# Patient Record
Sex: Female | Born: 1990 | Race: White | Hispanic: No | Marital: Single | Smoking: Never smoker
Health system: Southern US, Community
[De-identification: ages and names within clinical notes are randomized; demographics above are authoritative.]

---

## 2013-05-14 ENCOUNTER — Encounter (HOSPITAL_COMMUNITY): Payer: Self-pay | Admitting: Emergency Medicine

## 2013-05-14 ENCOUNTER — Emergency Department (HOSPITAL_COMMUNITY)
Admission: EM | Admit: 2013-05-14 | Discharge: 2013-05-14 | Disposition: A | Discharge status: Home / Self Care | Payer: BC Managed Care – PPO | Source: Home / Self Care | Attending: Family Medicine | Admitting: Family Medicine

## 2013-05-14 ENCOUNTER — Emergency Department (INDEPENDENT_AMBULATORY_CARE_PROVIDER_SITE_OTHER): Payer: BC Managed Care – PPO

## 2013-05-14 DIAGNOSIS — S2341XA Sprain of ribs, initial encounter: Secondary | ICD-10-CM

## 2013-05-14 DIAGNOSIS — S29011A Strain of muscle and tendon of front wall of thorax, initial encounter: Secondary | ICD-10-CM

## 2013-05-14 MED ORDER — KETOROLAC TROMETHAMINE 30 MG/ML IJ SOLN
INTRAMUSCULAR | Status: AC
  Filled 2013-05-14: qty 1

## 2013-05-14 MED ORDER — HYDROCODONE-ACETAMINOPHEN 5-325 MG PO TABS: 1.0000 | ORAL_TABLET | Freq: Four times a day (QID) | ORAL | Status: AC | PRN

## 2013-05-14 MED ORDER — KETOROLAC TROMETHAMINE 30 MG/ML IJ SOLN: 30.0000 mg | Freq: Once | INTRAMUSCULAR | Status: DC

## 2013-05-14 MED ORDER — KETOROLAC TROMETHAMINE 30 MG/ML IJ SOLN
30.0000 mg | Freq: Once | INTRAMUSCULAR | Status: AC
  Administered 2013-05-14: 30 mg via INTRAMUSCULAR

## 2013-05-14 MED ORDER — NAPROXEN 500 MG PO TABS: 500.0000 mg | ORAL_TABLET | Freq: Two times a day (BID) | ORAL | Status: AC

## 2013-05-14 NOTE — Discharge Instructions (Signed)
Your chest xray and ECG were normal. You have likely either strained or partially tore a small band of muscle (intercostal muscle) in between the ribs under your right breast. You can expect this area to be sore for at least 2-3 weeks. Please use medications as prescribed and follow up if you do not have improvement, develop fever or increasing shortness of breath.

## 2013-05-14 NOTE — ED Provider Notes (Signed)
CSN: [id number]     Arrival date & time 05/14/13  [id number] History   First MD Initiated Contact with Patient 05/14/13 1007     Chief Complaint  Patient presents with  . Chest Pain   (Consider location/radiation/quality/duration/timing/severity/associated sxs/prior Treatment) HPI Comments: Patient states she began to choke on some food while eating dinner on Sunday 05/11/2013 and had forceful coughing fit. During the coughing, she felt and heard a "pop" underneath her right breast and since that time this area has been very painful with coughing, deep inspiration, hiccups, sneezing, or overhead reach with right upper extremity. Denies fever or dyspnea. No hemoptysis. States when area of pain intensifies, it feels "almost like I am having a muscle spasm."  Patient is a 23 y.o. female presenting with chest pain. The history is provided by the patient.  Chest Pain   History reviewed. No pertinent past medical history. History reviewed. No pertinent past surgical history. History reviewed. No pertinent family history. History  Substance Use Topics  . Smoking status: Never Smoker   . Smokeless tobacco: Not on file  . Alcohol Use: Yes   OB History   Grav Para Term Preterm Abortions TAB SAB Ect Mult Living                 Review of Systems  Cardiovascular: Positive for chest pain.  All other systems reviewed and are negative.    Allergies  Review of patient's allergies indicates no known allergies.  Home Medications   Current Outpatient Rx  Name  Route  Sig  Dispense  Refill  . Acetaminophen (TYLENOL PO)   Oral   Take by mouth.          BP 115/76  Pulse 78  Temp(Src) 98.6 F (37 C) (Oral)  Resp 18  SpO2 100% Physical Exam  Nursing note and vitals reviewed. Constitutional: She is oriented to person, place, and time. She appears well-developed and well-nourished. No distress.  HENT:  Head: Normocephalic and atraumatic.  Eyes: Conjunctivae are normal.  Cardiovascular:  Normal rate, regular rhythm and normal heart sounds.   Pulmonary/Chest: Effort normal and breath sounds normal. No respiratory distress. She has no wheezes. She has no rales. She exhibits tenderness. She exhibits no mass, no bony tenderness, no crepitus, no deformity and no swelling.    Abdominal: Soft. Bowel sounds are normal. She exhibits no distension. There is no tenderness.  Musculoskeletal:  See chest  Neurological: She is alert and oriented to person, place, and time.  Skin: Skin is warm and dry. No rash noted.  Psychiatric: She has a normal mood and affect. Her behavior is normal.    ED Course  Procedures (including critical care time) Labs Review Labs Reviewed - No data to display Imaging Review No results found.  EKG Interpretation    Date/Time:    Ventricular Rate:    PR Interval:    QRS Duration:   QT Interval:    QTC Calculation:   R Axis:     Text Interpretation:              MDM  Exam suggestive of intercostal muscle injury secondary to forceful cough. ECG: Sinus brady at 59 bpm with no acute or dynamic ST/T wave changes or ectopy.  CXR: unremarkable Will advised NSAID use for mild to moderate pain and norco for severe pain and advise that she can expect to be uncomfortable for 2-3 weeks as area heals.  No clinical indication of acute pulmonary process.  Jess Barters [place], [place] 05/14/13 1135

## 2013-05-14 NOTE — ED Provider Notes (Signed)
Medical screening examination/treatment/procedure(s) were performed by a resident physician or non-physician practitioner and as the supervising physician I was immediately available for consultation/collaboration.  Clementeen GrahamEvan Tecora Eustache, MD    Rodolph BongEvan S Nicco Reaume, MD 05/14/13 1311

## 2013-05-14 NOTE — ED Notes (Signed)
Pt  Reports  sev  Weeks  Ago  She  Felt a  Pop  When  She    Felt a  Pop  r  Side  She  Had  Pain on  Movements   And  When  She  Takes  Deep breath  This  Am  She  Developed  Pain  r   Side   With pain on  Inspiration and  bp  And    Pulse  Was  Elevated      -      She  Is  Awake  Alert  Oriented  And  Is  Speaking in  Complete  sentances

## 2015-01-17 IMAGING — CR DG CHEST 2V
2 series · 2 of 2 positions shown · non-contrast
Comparison: None.

CLINICAL DATA: Chest pain

EXAM:
CHEST  2 VIEW

[view not recorded (1 of 2)]
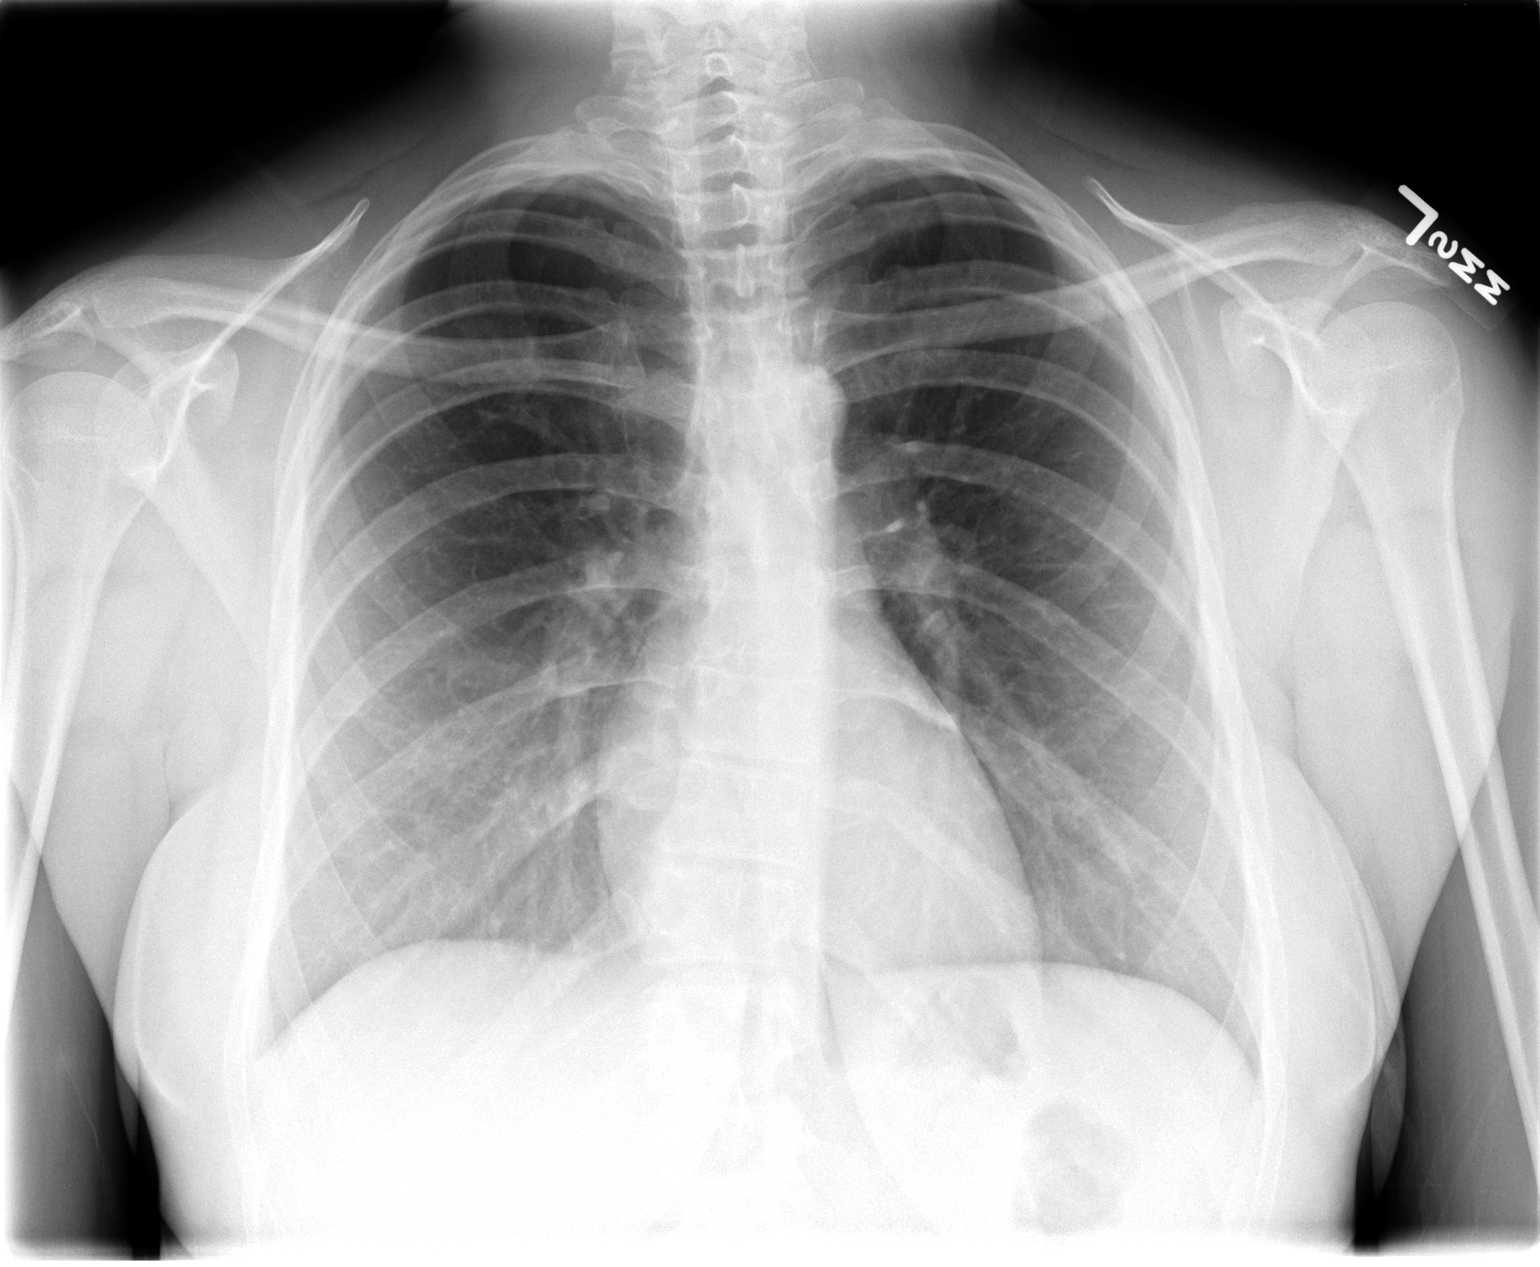

[view not recorded (2 of 2)]
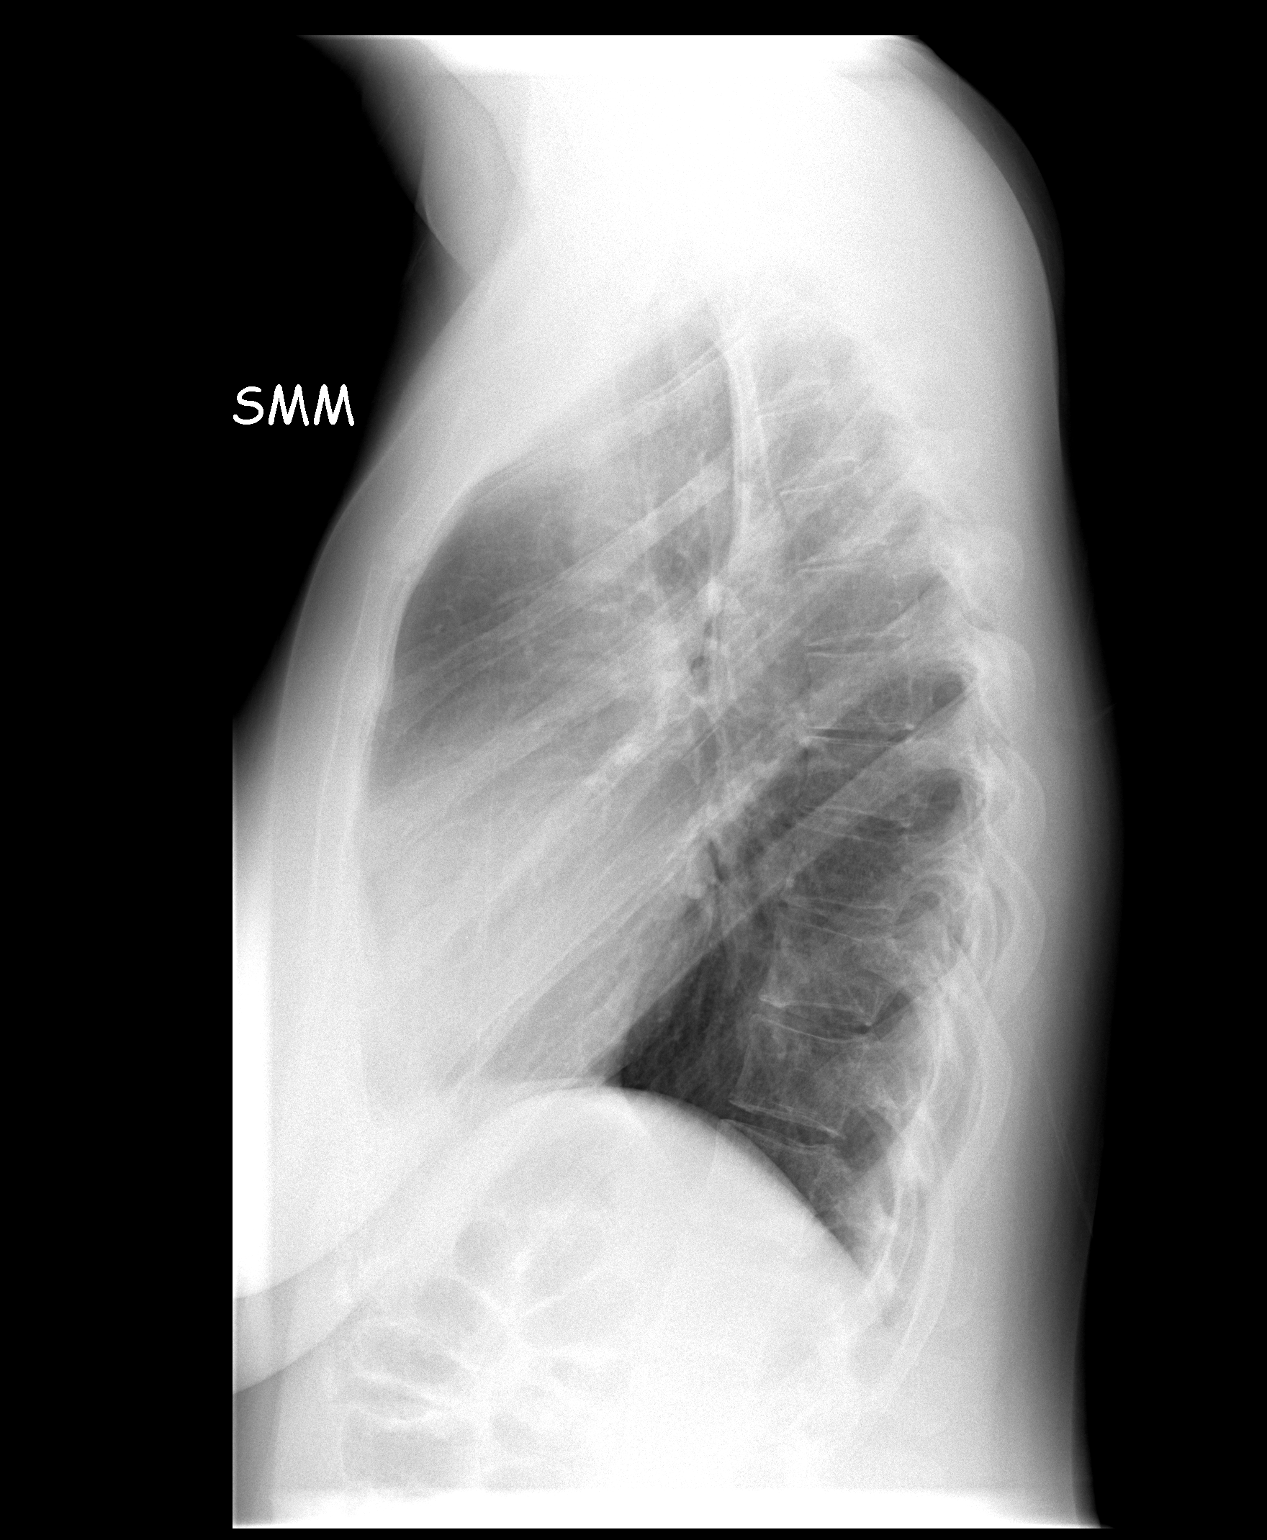

[2 of 2 positions shown; findings below may reference images not displayed]

FINDINGS: Lungs are clear. Heart size and pulmonary vascularity are normal. No
adenopathy. No pneumothorax. There is mild mid thoracic
levoscoliosis.
IMPRESSION: No edema or consolidation.
